# Patient Record
Sex: Male | Born: 1985 | Race: White | Hispanic: No | Marital: Married | Smoking: Never smoker
Health system: Southern US, Community
[De-identification: ages and names within clinical notes are randomized; demographics above are authoritative.]

## PROBLEM LIST (undated history)

## (undated) DIAGNOSIS — J45909 Unspecified asthma, uncomplicated: Secondary | ICD-10-CM

## (undated) HISTORY — DX: Unspecified asthma, uncomplicated: J45.909

---

## 2014-05-06 ENCOUNTER — Ambulatory Visit (HOSPITAL_BASED_OUTPATIENT_CLINIC_OR_DEPARTMENT_OTHER): Payer: BC Managed Care – PPO | Attending: Family Medicine | Admitting: Radiology

## 2014-05-06 VITALS — Ht 69.0 in | Wt 195.0 lb

## 2014-05-06 DIAGNOSIS — R0609 Other forms of dyspnea: Secondary | ICD-10-CM | POA: Diagnosis not present

## 2014-05-06 DIAGNOSIS — R0989 Other specified symptoms and signs involving the circulatory and respiratory systems: Secondary | ICD-10-CM | POA: Insufficient documentation

## 2014-05-06 DIAGNOSIS — G4710 Hypersomnia, unspecified: Secondary | ICD-10-CM | POA: Diagnosis present

## 2014-05-06 DIAGNOSIS — R5383 Other fatigue: Secondary | ICD-10-CM

## 2014-05-06 DIAGNOSIS — R5381 Other malaise: Secondary | ICD-10-CM | POA: Diagnosis not present

## 2014-05-06 DIAGNOSIS — R0683 Snoring: Secondary | ICD-10-CM

## 2014-05-06 DIAGNOSIS — G4730 Sleep apnea, unspecified: Secondary | ICD-10-CM | POA: Diagnosis present

## 2014-05-06 DIAGNOSIS — G479 Sleep disorder, unspecified: Secondary | ICD-10-CM | POA: Insufficient documentation

## 2014-05-09 DIAGNOSIS — R0683 Snoring: Secondary | ICD-10-CM

## 2014-05-09 DIAGNOSIS — R5381 Other malaise: Secondary | ICD-10-CM

## 2014-05-09 DIAGNOSIS — R5383 Other fatigue: Secondary | ICD-10-CM

## 2014-05-09 NOTE — Sleep Study (Signed)
   NAME: Lance BushMarcus West DATE OF BIRTH:  1985/09/03 MEDICAL RECORD NUMBER [id number]  LOCATION: [facility]  PHYSICIAN: Suleyman Ehrman D  DATE OF STUDY: 05/06/2014  SLEEP STUDY TYPE: Nocturnal Polysomnogram               REFERRING PHYSICIAN: SwazilandJordan, Betty G, MD  INDICATION FOR STUDY: Hypersomnia with sleep apnea  EPWORTH SLEEPINESS SCORE:   13/24 HEIGHT: 5\' 9"  (175.3 cm)  WEIGHT: 195 lb (88.451 kg)    Body mass index is 28.78 kg/(m^2).  NECK SIZE: 16 in.  MEDICATIONS: Charted for review  SLEEP ARCHITECTURE: Total sleep time 305 minutes with sleep efficiency 83.7%. Stage I was 11.3%, stage II 79%, stage III absent, REM 9.7% of total sleep time. Sleep latency 47 minutes, REM latency 202 minutes, awake after sleep onset 12 minutes, arousal index 15.9, bedtime medication: Albuterol  RESPIRATORY DATA: Apnea hypopneas index (AHI) 3.9 per hour. 20 total events scored including one obstructive apnea, 8 central apneas, 11 hypopneas. Events were recorded in all positions, especially while supine. REM AHI of 4.1 per hour. The study was ordered as a diagnostic polysomnogram without CPAP.  OXYGEN DATA: Mild snoring with oxygen desaturation to a nadir of 88% and mean saturation 95.3% on room air.  CARDIAC DATA: Sinus rhythm with PACs  MOVEMENT/PARASOMNIA: No significant movement disturbance, no bathroom trips  IMPRESSION/ RECOMMENDATION:   1) Within normal limits. Occasional respiratory event with sleep disturbance, AHI 3.9 per hour. The normal range for adults is an AHI from 0-5 events per hour). Mild snoring with oxygen desaturation to a nadir of 88% and mean saturation 95.3% on room air.   [name],[name] D Diplomate, American Board of Sleep Medicine  ELECTRONICALLY SIGNED ON:  05/09/2014, 11:34 AM [facility] PH: [phone]   FX: [phone] ACCREDITED BY THE AMERICAN ACADEMY OF SLEEP MEDICINE

## 2015-10-07 ENCOUNTER — Encounter (HOSPITAL_COMMUNITY): Payer: Self-pay | Admitting: Emergency Medicine

## 2015-10-07 ENCOUNTER — Emergency Department (HOSPITAL_COMMUNITY): Payer: BLUE CROSS/BLUE SHIELD

## 2015-10-07 ENCOUNTER — Emergency Department (HOSPITAL_COMMUNITY)
Admission: EM | Admit: 2015-10-07 | Discharge: 2015-10-07 | Disposition: A | Discharge status: Home / Self Care | Payer: BLUE CROSS/BLUE SHIELD | Attending: Emergency Medicine | Admitting: Emergency Medicine

## 2015-10-07 DIAGNOSIS — R55 Syncope and collapse: Secondary | ICD-10-CM | POA: Diagnosis present

## 2015-10-07 DIAGNOSIS — R0789 Other chest pain: Secondary | ICD-10-CM

## 2015-10-07 DIAGNOSIS — R079 Chest pain, unspecified: Secondary | ICD-10-CM | POA: Diagnosis not present

## 2015-10-07 DIAGNOSIS — Z79899 Other long term (current) drug therapy: Secondary | ICD-10-CM | POA: Diagnosis not present

## 2015-10-07 DIAGNOSIS — R001 Bradycardia, unspecified: Secondary | ICD-10-CM | POA: Insufficient documentation

## 2015-10-07 LAB — BASIC METABOLIC PANEL
Anion gap: 11 (ref 5–15)
BUN: 12 mg/dL (ref 6–20)
CHLORIDE: 102 mmol/L (ref 101–111)
CO2: 27 mmol/L (ref 22–32)
CREATININE: 1.31 mg/dL — AB (ref 0.61–1.24)
Calcium: 9.6 mg/dL (ref 8.9–10.3)
GFR calc Af Amer: >60 mL/min (ref >60)
GFR calc non Af Amer: >60 mL/min (ref >60)
GLUCOSE: 111 mg/dL — AB (ref 65–99)
Potassium: 4.4 mmol/L (ref 3.5–5.1)
SODIUM: 140 mmol/L (ref 135–145)

## 2015-10-07 LAB — CBC
HCT: 43.7 % (ref 39.0–52.0)
Hemoglobin: 14.9 g/dL (ref 13.0–17.0)
MCH: 29.3 pg (ref 26.0–34.0)
MCHC: 34.1 g/dL (ref 30.0–36.0)
MCV: 85.9 fL (ref 78.0–100.0)
PLATELETS: 232 10*3/uL (ref 150–400)
RBC: 5.09 MIL/uL (ref 4.22–5.81)
RDW: 12.9 % (ref 11.5–15.5)
WBC: 7.2 10*3/uL (ref 4.0–10.5)

## 2015-10-07 LAB — D-DIMER, QUANTITATIVE (NOT AT ARMC)

## 2015-10-07 LAB — I-STAT TROPONIN, ED: Troponin i, poc: 0 ng/mL (ref 0.00–0.08)

## 2015-10-07 MED ORDER — IBUPROFEN 400 MG PO TABS
600.0000 mg | ORAL_TABLET | Freq: Once | ORAL | Status: AC
  Administered 2015-10-07: 600 mg via ORAL
  Filled 2015-10-07: qty 1

## 2015-10-07 MED ORDER — DIAZEPAM 5 MG PO TABS
5.0000 mg | ORAL_TABLET | Freq: Once | ORAL | Status: AC
  Administered 2015-10-07: 5 mg via ORAL
  Filled 2015-10-07: qty 1

## 2015-10-07 MED ORDER — OXYCODONE-ACETAMINOPHEN 5-325 MG PO TABS: 2.0000 | ORAL_TABLET | ORAL | Status: DC | PRN

## 2015-10-07 MED ORDER — METHOCARBAMOL 750 MG PO TABS: 750.0000 mg | ORAL_TABLET | Freq: Four times a day (QID) | ORAL | Status: DC

## 2015-10-07 MED ORDER — NAPROXEN 500 MG PO TABS: 500.0000 mg | ORAL_TABLET | Freq: Two times a day (BID) | ORAL | Status: DC

## 2015-10-07 MED ORDER — OXYCODONE-ACETAMINOPHEN 5-325 MG PO TABS
1.0000 | ORAL_TABLET | Freq: Once | ORAL | Status: AC
  Administered 2015-10-07: 1 via ORAL
  Filled 2015-10-07: qty 1

## 2015-10-07 NOTE — Discharge Instructions (Signed)

## 2015-10-07 NOTE — ED Provider Notes (Addendum)
CSN: [id number]     Arrival date & time 10/07/15  1525 History   First MD Initiated Contact with Patient 10/07/15 2022     Chief Complaint  Patient presents with  . 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 No associated anginal symptoms. Pain is better with remaining still. This also worse with taking a deep breath. Pain also is worse when he moves his left arm. No associated syncope or near-syncopal. No prior history of same. No associated diaphoresis or dyspnea. Use prior to arrival  Patient is a 30 y.o. male presenting with chest pain. The history is provided by the patient and the spouse.  Chest Pain   History reviewed. No pertinent past medical history. History reviewed. No pertinent past surgical history. No family history on file. Social History  Substance Use Topics  . Smoking status: Never Smoker   . Smokeless tobacco: None  . Alcohol Use: No    Review of Systems  Cardiovascular: Positive for chest pain.  All other systems reviewed and are negative.     Allergies  Ancef  Home Medications   Prior to Admission medications   Medication Sig Start Date End Date Taking? Authorizing Provider  Cyanocobalamin (VITAMIN B 12 PO) Take 1 tablet by mouth daily.   Yes Historical Provider, MD  DULoxetine (CYMBALTA) 60 MG capsule Take 60 mg by mouth daily.   Yes Historical Provider, MD   BP 148/99 mmHg  Pulse 101  Temp(Src) 98.1 F (36.7 C) (Oral)  Resp 16  Ht  (1.753 m)  Wt 95.255 kg  BMI 31.00 kg/m2  SpO2 98% Physical Exam  Constitutional: He is oriented to person, place, and time. He appears well-developed and well-nourished.  Non-toxic appearance. No distress.  HENT:  Head: Normocephalic and atraumatic.  Eyes: Conjunctivae, EOM and lids are normal. Pupils  are equal, round, and reactive to light.  Neck: Normal range of motion. Neck supple. No tracheal deviation present. No thyroid mass present.  Cardiovascular: Normal rate, regular rhythm and normal heart sounds.  Exam reveals no gallop.   No murmur heard. Pulmonary/Chest: Effort normal and breath sounds normal. No stridor. No respiratory distress. He has no decreased breath sounds. He has no wheezes. He has no rhonchi. He has no rales. He exhibits bony tenderness. He exhibits no crepitus.    Abdominal: Soft. Normal appearance and bowel sounds are normal. He exhibits no distension. There is no tenderness. There is no rebound and no CVA tenderness.  Musculoskeletal: Normal range of motion. He exhibits no edema or tenderness.  Neurological: He is alert and oriented to person, place, and time. He has normal strength. No cranial nerve deficit or sensory deficit. GCS eye subscore is 4. GCS verbal subscore is 5. GCS motor subscore is 6.  Skin: Skin is warm and dry. No abrasion and no rash noted.  Psychiatric: He has a normal mood and affect. His speech is normal and behavior is normal.  Nursing note and vitals reviewed.   ED Course  Procedures (including critical care time) Labs Review Labs Reviewed  BASIC METABOLIC PANEL - Abnormal; Notable for the following:    Glucose, Bld 111 (*)    Creatinine, Ser 1.31 (*)    All other components within normal limits  CBC  Rosezena Sensor, ED    Imaging Review Dg Chest 2 View  10/07/2015  CLINICAL DATA:  Left-sided chest pain EXAM: CHEST  2 VIEW COMPARISON:  None. FINDINGS: The heart size and mediastinal contours are within normal limits. Both lungs are clear. The visualized skeletal structures are unremarkable. IMPRESSION: No active cardiopulmonary disease. Electronically Signed   By: Alcide Clever M.D.   On: 10/07/2015 16:15   I have personally reviewed and evaluated these images and lab results as part of my medical decision-making.   EKG  Interpretation   Date/Time:  Thursday October 07 2015 15:29:07 EST Ventricular Rate:  107 PR Interval:  150 QRS Duration: 90 QT Interval:  332 QTC Calculation: 443 R Axis:   65 Text Interpretation:  Sinus tachycardia Otherwise normal ECG Confirmed by  Kahlani Graber  MD, Annais Crafts ([id number]) on 10/07/2015 8:37:00 PM      MDM   Final diagnoses:  None     Patient with negative d-dimer here. Suspect chest wall pain. Doubt ACS or PE. Patient stable for discharge    Lorre Nick, MD 10/07/15 2221  Lorre Nick, MD 10/07/15 2226

## 2015-10-07 NOTE — ED Notes (Signed)
Pt was at work today and was lifting heavy espresso machine and had a sudden onset of sharp left sided chest pain. Pt states pain is still there worse with a deep breath. Pt also states at the time he had a discomfort in his left arm.

## 2019-05-22 ENCOUNTER — Other Ambulatory Visit (HOSPITAL_COMMUNITY): Payer: Self-pay | Admitting: Family Medicine

## 2019-05-22 DIAGNOSIS — Z8279 Family history of other congenital malformations, deformations and chromosomal abnormalities: Secondary | ICD-10-CM

## 2019-06-02 ENCOUNTER — Encounter (INDEPENDENT_AMBULATORY_CARE_PROVIDER_SITE_OTHER): Payer: Self-pay

## 2019-06-02 ENCOUNTER — Other Ambulatory Visit: Payer: Self-pay

## 2019-06-02 ENCOUNTER — Ambulatory Visit (HOSPITAL_COMMUNITY): Payer: BLUE CROSS/BLUE SHIELD | Attending: Cardiology

## 2019-06-02 DIAGNOSIS — Z8279 Family history of other congenital malformations, deformations and chromosomal abnormalities: Secondary | ICD-10-CM | POA: Diagnosis not present

## 2019-09-11 DIAGNOSIS — Z03818 Encounter for observation for suspected exposure to other biological agents ruled out: Secondary | ICD-10-CM | POA: Diagnosis not present

## 2019-09-11 DIAGNOSIS — U071 COVID-19: Secondary | ICD-10-CM | POA: Diagnosis not present

## 2019-09-11 DIAGNOSIS — Z20828 Contact with and (suspected) exposure to other viral communicable diseases: Secondary | ICD-10-CM | POA: Diagnosis not present

## 2019-09-11 DIAGNOSIS — Z20822 Contact with and (suspected) exposure to covid-19: Secondary | ICD-10-CM | POA: Diagnosis not present

## 2020-02-18 DIAGNOSIS — F419 Anxiety disorder, unspecified: Secondary | ICD-10-CM | POA: Diagnosis not present

## 2020-02-18 DIAGNOSIS — J45909 Unspecified asthma, uncomplicated: Secondary | ICD-10-CM | POA: Diagnosis not present

## 2020-02-18 DIAGNOSIS — E782 Mixed hyperlipidemia: Secondary | ICD-10-CM | POA: Diagnosis not present

## 2020-11-08 ENCOUNTER — Other Ambulatory Visit: Payer: Self-pay

## 2020-11-08 ENCOUNTER — Ambulatory Visit (INDEPENDENT_AMBULATORY_CARE_PROVIDER_SITE_OTHER): Payer: 59 | Admitting: Family Medicine

## 2020-11-08 VITALS — BP 126/84 | Ht 69.0 in | Wt 215.0 lb

## 2020-11-08 DIAGNOSIS — M5010 Cervical disc disorder with radiculopathy, unspecified cervical region: Secondary | ICD-10-CM

## 2020-11-08 MED ORDER — PREDNISONE 10 MG PO TABS: ORAL_TABLET | ORAL | 0 refills | Status: DC

## 2020-11-08 NOTE — Patient Instructions (Signed)
You have cervical radiculopathy (a pinched nerve in the neck). Prednisone 6 day dose pack to relieve irritation/inflammation of the nerve. Don't take aleve or ibuprofen while on prednisone. Ok to take tylenol if you need something in addition to prednisone. Consider muscle relaxant if you're having trouble sleeping. Consider cervical collar if severely painful. Simple range of motion exercises within limits of pain to prevent further stiffness. Consider physical therapy for stretching, exercises, traction, and modalities. Heat 15 minutes at a time 3-4 times a day to help with spasms. Watch head position when on computers, texting, when sleeping in bed - should in line with back to prevent further nerve traction and irritation. Consider home traction unit if you get benefit with this in physical therapy. If not improving we will consider an MRI. Keep me updated on how you're doing over the next week.

## 2020-11-09 ENCOUNTER — Encounter: Payer: Self-pay | Admitting: Family Medicine

## 2020-11-09 NOTE — Progress Notes (Signed)
PCP: Joycelyn Rua, MD  Subjective:   HPI: Patient is a 35 y.o. male here for neck pain.  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 No numbness or tingling. No radiation past the elbow. Pain has persisted and worse with extension of neck, cough Has seen chiropractor twice without much benefit. Prior history of labral surgery of right shoulder with continued subluxation episodes, most recent 1.5 years ago but current pain feels different. Difficulty sleeping and throwing. No bowel/bladder dysfunction.  History reviewed. No pertinent past medical history.  Current Outpatient Medications on File Prior to Visit  Medication Sig Dispense Refill  . Cyanocobalamin (VITAMIN B 12 PO) Take 1 tablet by mouth daily.    . DULoxetine (CYMBALTA) 60 MG capsule Take 60 mg by mouth daily.     No current facility-administered medications on file prior to visit.    History reviewed. No pertinent surgical history.  Allergies  Allergen Reactions  . Ancef [Cefazolin]     Nausea     Social History   Socioeconomic History  . Marital status: Married    Spouse name: Not on file  . Number of children: Not on file  . Years of education: Not on file  . Highest education level: Not on file  Occupational History  . Not on file  Tobacco Use  . Smoking status: Never Smoker  . Smokeless tobacco: Not on file  Substance and Sexual Activity  . Alcohol use: No  . Drug use: Not on file  . Sexual activity: Not on file  Other Topics Concern  . Not on file  Social History Narrative  . Not on file   Social Determinants of Health   Financial Resource Strain: Not on file  Food Insecurity: Not on file  Transportation Needs: Not on file  Physical Activity: Not on file  Stress: Not on file  Social Connections: Not on file  Intimate Partner Violence: Not on file    History reviewed. No pertinent family history.  BP 126/84    Ht 5\' 9"  (1.753 m)   Wt 215 lb (97.5 kg)   BMI 31.75 kg/m   No flowsheet data found.  No flowsheet data found.  Review of Systems: See HPI above.     Objective:  Physical Exam:  Gen: NAD, comfortable in exam room  Neck: No gross deformity, swelling, bruising. TTP right trapezius, cervical paraspinal region.  No midline/bony TTP. Extension only to 15 degrees with pain.  Pain reproduced with flexion and left lateral rotation as well. BUE strength 5/5.   Sensation intact to light touch.   Trace equal reflexes in triceps, biceps, brachioradialis tendons. Mild positive spurlings.  Right shoulder: No swelling, ecchymoses.  No gross deformity. No TTP. FROM. Negative Hawkins, Neers. Negative Yergasons. Strength 5/5 with empty can and resisted internal/external rotation. Negative apprehension. NV intact distally.  Complete MSK u/s right shoulder: Biceps tendon: intact without tenosynovitis Pec major tendon: intact Subscapularis: intact without abnormalities AC joint: minimal arthropathy Infraspinatus: intact without abnormalities Supraspinatus: intact without abnormalities Posterior glenohumeral joint: no effusion.  Visible labrum appears normal.  Impression:  Normal ultrasound right shoulder    Assessment & Plan:  1. Cervical radiculopathy - patient's history and exam along with reassuring ultrasound of right shoulder consistent with cervical radiculopathy.  No red flags.  Start prednisone dose pack.  Ergonomic issues discussed.  Heat if needed.  Consider MRI if he doesn't improve as expected.  Easy home exercises - consider formal physical therapy as well.  Let us know how he's doing over next week.

## 2020-11-30 ENCOUNTER — Encounter: Payer: Self-pay | Admitting: Family Medicine

## 2020-12-13 NOTE — Progress Notes (Signed)
[name] [address] [place] [address] Phone: [phone] Subjective:   I [name] am serving as a Neurosurgeon for Dr. Antoine Primas.  This visit occurred during the SARS-CoV-2 public health emergency.  Safety protocols were in place, including screening questions prior to the visit, additional usage of staff PPE, and extensive cleaning of exam room while observing appropriate contact time as indicated for disinfecting solutions.   I'm seeing this patient by the request  of:  Joycelyn Rua, MD  CC: Right-sided neck pain  [id number]  Lance West is a 35 y.o. male coming in with complaint of neck and R shoulder pain for one month after drying hair. Patient states he was drying his hair when he felt a tweak that has gotten worse.   Onset- 3 months  Location - neck is where the pain is and with certain ROM he has numbness down the arm  Duration- consistent  Character- dull annoying pain Aggravating factors- rotation of the neck, leaning over  Therapies tried- pain reliever, ice, heat, chiropractor twice, PT, steriods Severity- 2/10 at its worse      No past medical history on file. No past surgical history on file. Social History   Socioeconomic History  . Marital status: Married    Spouse name: Not on file  . Number of children: Not on file  . Years of education: Not on file  . Highest education level: Not on file  Occupational History  . Not on file  Tobacco Use  . Smoking status: Never Smoker  . Smokeless tobacco: Not on file  Substance and Sexual Activity  . Alcohol use: No  . Drug use: Not on file  . Sexual activity: Not on file  Other Topics Concern  . Not on file  Social History Narrative  . Not on file   Social Determinants of Health   Financial Resource Strain: Not on file  Food Insecurity: Not on file  Transportation Needs: Not on file  Physical Activity: Not on file  Stress: Not on file  Social  Connections: Not on file   Allergies  Allergen Reactions  . Ancef [Cefazolin]     Nausea    No family history on file.  Current Outpatient Medications (Endocrine & Metabolic):  .  predniSONE (DELTASONE) 10 MG tablet, 6 tabs po day 1, 5 tabs po day 2, 4 tabs po day 3, 3 tabs po day 4, 2 tabs po day 5, 1 tab po day 6    Current Outpatient Medications (Analgesics):  .  meloxicam (MOBIC) 15 MG tablet, Take 1 tablet (15 mg total) by mouth daily.  Current Outpatient Medications (Hematological):  Marland Kitchen  Cyanocobalamin (VITAMIN B 12 PO), Take 1 tablet by mouth daily.  Current Outpatient Medications (Other):  Marland Kitchen  DULoxetine (CYMBALTA) 60 MG capsule, Take 60 mg by mouth daily. Marland Kitchen  gabapentin (NEURONTIN) 100 MG capsule, Take 2 capsules (200 mg total) by mouth at bedtime.   Reviewed prior external information including notes and imaging from  primary care provider As well as notes that were available from care everywhere and other healthcare systems.  Past medical history, social, surgical and family history all reviewed in electronic medical record.  No pertanent information unless stated regarding to the chief complaint.   Review of Systems:  No headache, visual changes, nausea, vomiting, diarrhea, constipation, dizziness, abdominal pain, skin rash, fevers, chills, night sweats, weight loss, swollen lymph nodes, body aches, joint swelling, chest pain, shortness of  breath, mood changes. POSITIVE muscle aches  Objective  Blood pressure 132/82, pulse 85, height 5\' 9"  (1.753 m), weight 220 lb (99.8 kg), SpO2 97 %.   General: No apparent distress alert and oriented x3 mood and affect normal, dressed appropriately.  HEENT: Pupils equal, extraocular movements intact  Respiratory: Patient's speak in full sentences and does not appear short of breath  Cardiovascular: No lower extremity edema, non tender, no erythema  Gait normal with good balance and coordination.  MSK:   Right side of the neck  does have some mild tightness compared to the contralateral side.  Patient does have some limited sidebending.  Negative Spurling's today but patient does have weakness noted in the C8 distribution compared to the contralateral side.  Patient also does have tightness noted with extension of the neck.  Good range of motion of the shoulder.  Negative impingement.  5-5 strength of the rotator cuff.  Osteopathic findings C4 flexed rotated and side bent right C7 flexed rotated and side bent right T3 extended rotated and side bent left    Impression and Recommendations:    The above documentation has been reviewed and is accurate and complete , DO

## 2020-12-15 ENCOUNTER — Ambulatory Visit (INDEPENDENT_AMBULATORY_CARE_PROVIDER_SITE_OTHER): Payer: 59 | Admitting: Family Medicine

## 2020-12-15 ENCOUNTER — Other Ambulatory Visit: Payer: Self-pay

## 2020-12-15 ENCOUNTER — Encounter: Payer: Self-pay | Admitting: Family Medicine

## 2020-12-15 ENCOUNTER — Ambulatory Visit (INDEPENDENT_AMBULATORY_CARE_PROVIDER_SITE_OTHER): Payer: 59

## 2020-12-15 VITALS — BP 132/82 | HR 85 | Ht 69.0 in | Wt 220.0 lb

## 2020-12-15 DIAGNOSIS — G8929 Other chronic pain: Secondary | ICD-10-CM | POA: Diagnosis not present

## 2020-12-15 DIAGNOSIS — M542 Cervicalgia: Secondary | ICD-10-CM

## 2020-12-15 DIAGNOSIS — M9902 Segmental and somatic dysfunction of thoracic region: Secondary | ICD-10-CM

## 2020-12-15 DIAGNOSIS — M9901 Segmental and somatic dysfunction of cervical region: Secondary | ICD-10-CM

## 2020-12-15 MED ORDER — GABAPENTIN 100 MG PO CAPS: 200.0000 mg | ORAL_CAPSULE | Freq: Every day | ORAL | 3 refills | Status: DC

## 2020-12-15 MED ORDER — MELOXICAM 15 MG PO TABS: 15.0000 mg | ORAL_TABLET | Freq: Every day | ORAL | 0 refills | Status: DC

## 2020-12-15 NOTE — Assessment & Plan Note (Signed)
Patient does have what appears to be more neck pain.  Patient does have unfortunately numbness and does have some weakness in the C8 distribution.  Patient did respond fairly well to the osteopathic manipulation.  He started on a very low-dose of gabapentin as well as meloxicam during the day.  Patient has discontinued the prednisone.  We will get x-rays of the cervical spine.  Worsening pain I do feel that MRI is necessary.  Given home exercises at this point with patient not responding well to the physical therapy.  Follow-up with me again in 3 to 4 weeks.  Patient knows if worsening symptoms to seek medical attention immediately.

## 2020-12-15 NOTE — Patient Instructions (Addendum)
Good to see you Xray today meloxicam 15 mg daily for 10 days then as needed  Do not use NSAIDS such as Advil or Aleve when taking Meloxicam It is ok to use Tylenol for additional pain relief stop if it hurts your stomach Gabapentin 200 mg at night Keep hands within peripheral vision Tell Vincenza Hews he has to coach for now on See me again in 3-4 weeks ok to double book  Worsening pain write me we will get MRI

## 2020-12-15 NOTE — Assessment & Plan Note (Signed)
   Decision today to treat with OMT was based on Physical Exam  After verbal consent patient was treated with HVLA, ME, FPR techniques in cervical, thoracic,areas,   Patient tolerated the procedure well with improvement in symptoms  Patient given exercises, stretches and lifestyle modifications  See medications in patient instructions if given  Patient will follow up in 3-4 weeks

## 2021-01-05 NOTE — Progress Notes (Signed)
[facility] [address] [place] [address] Phone: [phone] Subjective:   I Lance West am serving as a Neurosurgeon for Dr. Antoine Primas.  This visit occurred during the SARS-CoV-2 public health emergency.  Safety protocols were in place, including screening questions prior to the visit, additional usage of staff PPE, and extensive cleaning of exam room while observing appropriate contact time as indicated for disinfecting solutions.   I'm seeing this patient by the request  of:  Joycelyn Rua, MD  CC: Neck pain follow-up  [id number]  Lance West is a 35 y.o. male coming in with complaint of back and neck pain. OMT 12/15/2020. Patient states he is still in pain. States he can feel a little difference from the medicine. States with increase activity the back is sore and he still has numbness in his arm.   Medications patient has been prescribed: Gabapentin, Meloxicam  Taking:  Cervical xray 12/15/2020 IMPRESSION: Mild reversal of lordotic curvature is likely due to chronic muscle spasm. No fracture or spondylolisthesis. No appreciable disc space narrowing. Facet hypertrophy with exit foraminal narrowing is noted at C4-5 bilaterally        Reviewed prior external information including notes and imaging from previsou exam, outside providers and external EMR if available.   As well as notes that were available from care everywhere and other healthcare systems.  Past medical history, social, surgical and family history all reviewed in electronic medical record.  No pertanent information unless stated regarding to the chief complaint.   No past medical history on file.  Allergies  Allergen Reactions  . Ancef [Cefazolin]     Nausea      Review of Systems:  No headache, visual changes, nausea, vomiting, diarrhea, constipation, dizziness, abdominal pain, skin rash, fevers, chills, night sweats, weight loss, swollen lymph nodes, body  aches, joint swelling, chest pain, shortness of breath, mood changes. POSITIVE muscle aches  Objective  Blood pressure 130/90, pulse 91, height 5\' 9"  (1.753 m), weight 221 lb (100.2 kg), SpO2 96 %.   General: No apparent distress alert and oriented x3 mood and affect normal, dressed appropriately.  HEENT: Pupils equal, extraocular movements intact  Respiratory: Patient's speak in full sentences and does not appear short of breath  Cardiovascular: No lower extremity edema, non tender, no erythema  Gait normal with good balance and coordination.  MSK:  Non tender with full range of motion and good stability and symmetric strength and tone of shoulders, elbows, wrist, hip, knee and ankles bilaterally.  Back -neck exam shows continued mild right-sided tightness noted of the neck.  Patient does have a positive Spurling in the C5 and C6 distribution on the right arm.  Patient does have maybe weakness now noted in the brachial radialis reflex mild decrease in grip strength as well.         Assessment and Plan:  Neck pain on right side Patient continues to have radicular symptoms unfortunately going down the right arm in the C5 and C6 distribution.  Concerning at this moment with patient failing all conservative therapy.  We discussed the potential aspect of continuing with the osteopathic manipulation but no significant improvement was noted on the initial 1.  At this point patient has seen other providers for this and has not made significant strides.  We discussed the possibility of advanced imaging which patient has agreed to.  Patient has failed all other conservative therapy including medications, physical therapy and manipulation at this time.  Depending on findings we will discuss if patient is a candidate for epidural injections.        The above documentation has been reviewed and is accurate and complete Judi Saa, DO       Note: This dictation was prepared with Dragon  dictation along with smaller phrase technology. Any transcriptional errors that result from this process are unintentional.

## 2021-01-06 ENCOUNTER — Encounter: Payer: Self-pay | Admitting: Family Medicine

## 2021-01-06 ENCOUNTER — Ambulatory Visit (INDEPENDENT_AMBULATORY_CARE_PROVIDER_SITE_OTHER): Payer: 59 | Admitting: Family Medicine

## 2021-01-06 ENCOUNTER — Other Ambulatory Visit: Payer: Self-pay

## 2021-01-06 VITALS — BP 130/90 | HR 91 | Ht 69.0 in | Wt 221.0 lb

## 2021-01-06 DIAGNOSIS — M542 Cervicalgia: Secondary | ICD-10-CM | POA: Diagnosis not present

## 2021-01-06 NOTE — Patient Instructions (Signed)
Good to see you MRI of the neck Try 3 pills of gabapentin at night for a week If it improves contact us for prescription if no improvement stop After MRI I will talk to you about next steps [facility] [phone]

## 2021-01-06 NOTE — Assessment & Plan Note (Signed)
Patient continues to have radicular symptoms unfortunately going down the right arm in the C5 and C6 distribution.  Concerning at this moment with patient failing all conservative therapy.  We discussed the potential aspect of continuing with the osteopathic manipulation but no significant improvement was noted on the initial 1.  At this point patient has seen other providers for this and has not made significant strides.  We discussed the possibility of advanced imaging which patient has agreed to.  Patient has failed all other conservative therapy including medications, physical therapy and manipulation at this time.  Depending on findings we will discuss if patient is a candidate for epidural injections.

## 2021-01-10 ENCOUNTER — Ambulatory Visit
Admission: RE | Admit: 2021-01-10 | Discharge: 2021-01-10 | Disposition: A | Discharge status: Home / Self Care | Payer: 59 | Source: Ambulatory Visit | Attending: Family Medicine | Admitting: Family Medicine

## 2021-01-10 ENCOUNTER — Other Ambulatory Visit: Payer: Self-pay

## 2021-01-10 DIAGNOSIS — M542 Cervicalgia: Secondary | ICD-10-CM

## 2021-01-12 ENCOUNTER — Other Ambulatory Visit: Payer: Self-pay

## 2021-01-12 DIAGNOSIS — M5412 Radiculopathy, cervical region: Secondary | ICD-10-CM

## 2021-01-18 ENCOUNTER — Ambulatory Visit
Admission: RE | Admit: 2021-01-18 | Discharge: 2021-01-18 | Disposition: A | Discharge status: Home / Self Care | Payer: 59 | Source: Ambulatory Visit | Attending: Family Medicine | Admitting: Family Medicine

## 2021-01-18 DIAGNOSIS — M5412 Radiculopathy, cervical region: Secondary | ICD-10-CM

## 2021-01-18 MED ORDER — IOPAMIDOL (ISOVUE-M 300) INJECTION 61%
1.0000 mL | Freq: Once | INTRAMUSCULAR | Status: AC | PRN
  Administered 2021-01-18: 1 mL via EPIDURAL

## 2021-01-18 MED ORDER — TRIAMCINOLONE ACETONIDE 40 MG/ML IJ SUSP (RADIOLOGY)
60.0000 mg | Freq: Once | INTRAMUSCULAR | Status: AC
  Administered 2021-01-18: 60 mg via EPIDURAL

## 2021-01-18 NOTE — Discharge Instructions (Signed)
Post Procedure Spinal Discharge Instruction Sheet  You may resume a regular diet and any medications that you routinely take (including pain medications).  No driving day of procedure.  Light activity throughout the rest of the day.  Do not do any strenuous work, exercise, bending or lifting.  The day following the procedure, you can resume normal physical activity but you should refrain from exercising or physical therapy for at least three days thereafter.   Common Side Effects:  Headaches- take your usual medications as directed by your physician.  Increase your fluid intake.  Caffeinated beverages may be helpful.  Lie flat in bed until your headache resolves.  Restlessness or inability to sleep- you may have trouble sleeping for the next few days.  Ask your referring physician if you need any medication for sleep.  Facial flushing or redness- should subside within a few days.  Increased pain- a temporary increase in pain a day or two following your procedure is not unusual.  Take your pain medication as prescribed by your referring physician.  Leg cramps  Please contact our office at [phone] for the following symptoms: Fever greater than 100 degrees. Headaches unresolved with medication after 2-3 days. Increased swelling, pain, or redness at injection site.   Thank you for visiting [facility] today.   

## 2021-02-18 ENCOUNTER — Other Ambulatory Visit: Payer: Self-pay | Admitting: Family Medicine

## 2021-03-16 ENCOUNTER — Encounter: Payer: Self-pay | Admitting: Family Medicine

## 2021-03-21 NOTE — Progress Notes (Signed)
[facility] [address] [place] [address] Phone: [phone] Subjective:   Lance West, am serving as a scribe for Dr. Antoine Primas.  I'm seeing this patient by the request  of:  Joycelyn Rua, MD  CC: Neck pain follow-up  [id number]  01/06/2021 Patient continues to have radicular symptoms unfortunately going down the right arm in the C5 and C6 distribution.  Concerning at this moment with patient failing all conservative therapy.  We discussed the potential aspect of continuing with the osteopathic manipulation but no significant improvement was noted on the initial 1.  At this point patient has seen other providers for this and has not made significant strides.  We discussed the possibility of advanced imaging which patient has agreed to.  Patient has failed all other conservative therapy including medications, physical therapy and manipulation at this time.  Depending on findings we will discuss if patient is a candidate for epidural injections.  Update 03/22/2021 Lance West is a 35 y.o. male coming in with complaint of neck pain. Epidural on 01/18/2021. States that the epidural lasted a month and now the pain is back and on the left side as well.  Patient states that it seems to be more of a tightness.  Not having as much visible radicular symptoms.  Patient wants to avoid getting that bad though.      No past medical history on file. No past surgical history on file. Social History   Socioeconomic History   Marital status: Married    Spouse name: Not on file   Number of children: Not on file   Years of education: Not on file   Highest education level: Not on file  Occupational History   Not on file  Tobacco Use   Smoking status: Never   Smokeless tobacco: Not on file  Substance and Sexual Activity   Alcohol use: No   Drug use: Not on file   Sexual activity: Not on file  Other Topics Concern   Not on file  Social  History Narrative   Not on file   Social Determinants of Health   Financial Resource Strain: Not on file  Food Insecurity: Not on file  Transportation Needs: Not on file  Physical Activity: Not on file  Stress: Not on file  Social Connections: Not on file   Allergies  Allergen Reactions   Ancef [Cefazolin]     Nausea    No family history on file.      Current Outpatient Medications (Hematological):    Cyanocobalamin (VITAMIN B 12 PO), Take 1 tablet by mouth daily.  Current Outpatient Medications (Other):    DULoxetine (CYMBALTA) 60 MG capsule, Take 60 mg by mouth daily.   Reviewed prior external information including notes and imaging from  primary care provider As well as notes that were available from care everywhere and other healthcare systems.  Past medical history, social, surgical and family history all reviewed in electronic medical record.  No pertanent information unless stated regarding to the chief complaint.   Review of Systems:  No headache, visual changes, nausea, vomiting, diarrhea, constipation, dizziness, abdominal pain, skin rash, fevers, chills, night sweats, weight loss, swollen lymph nodes, body aches, joint swelling, chest pain, shortness of breath, mood changes. POSITIVE muscle aches  Objective  Blood pressure 110/80, pulse 82, height 5\' 9"  (1.753 m), weight 215 lb (97.5 kg), SpO2 97 %.   General: No apparent distress alert and oriented x3 mood and affect  normal, dressed appropriately.  HEENT: Pupils equal, extraocular movements intact  Respiratory: Patient's speak in full sentences and does not appear short of breath  Cardiovascular: No lower extremity edema, non tender, no erythema  Gait normal with good balance and coordination.  MSK: Neck exam does still have significant tightness noted in the paraspinal musculature of the thoracic spine in the parascapular region.  Neck exam does have some mild limited range of motion especially with  right-sided sidebending and right-sided rotation.  Osteopathic findings C5 flexed rotated and side bent right C7 flexed rotated and side bent left T4 extended rotated and side bent right with inhaled rib T8 extended rotated and side bent left   Impression and Recommendations:     The above documentation has been reviewed and is accurate and complete Judi Saa, DO

## 2021-03-22 ENCOUNTER — Encounter: Payer: Self-pay | Admitting: Family Medicine

## 2021-03-22 ENCOUNTER — Ambulatory Visit (INDEPENDENT_AMBULATORY_CARE_PROVIDER_SITE_OTHER): Payer: 59 | Admitting: Family Medicine

## 2021-03-22 ENCOUNTER — Other Ambulatory Visit: Payer: Self-pay

## 2021-03-22 VITALS — BP 110/80 | HR 82 | Ht 69.0 in | Wt 215.0 lb

## 2021-03-22 DIAGNOSIS — M9901 Segmental and somatic dysfunction of cervical region: Secondary | ICD-10-CM | POA: Diagnosis not present

## 2021-03-22 DIAGNOSIS — M2550 Pain in unspecified joint: Secondary | ICD-10-CM | POA: Diagnosis not present

## 2021-03-22 DIAGNOSIS — M5412 Radiculopathy, cervical region: Secondary | ICD-10-CM | POA: Diagnosis not present

## 2021-03-22 DIAGNOSIS — M542 Cervicalgia: Secondary | ICD-10-CM

## 2021-03-22 DIAGNOSIS — M9902 Segmental and somatic dysfunction of thoracic region: Secondary | ICD-10-CM | POA: Diagnosis not present

## 2021-03-22 DIAGNOSIS — M9908 Segmental and somatic dysfunction of rib cage: Secondary | ICD-10-CM

## 2021-03-22 LAB — CBC WITH DIFFERENTIAL/PLATELET
Basophils Absolute: 0 10*3/uL (ref 0.0–0.1)
Basophils Relative: 0.7 % (ref 0.0–3.0)
Eosinophils Absolute: 0.2 10*3/uL (ref 0.0–0.7)
Eosinophils Relative: 3.6 % (ref 0.0–5.0)
HCT: 43.6 % (ref 39.0–52.0)
Hemoglobin: 14.9 g/dL (ref 13.0–17.0)
Lymphocytes Relative: 37.8 % (ref 12.0–46.0)
Lymphs Abs: 2 10*3/uL (ref 0.7–4.0)
MCHC: 34.3 g/dL (ref 30.0–36.0)
MCV: 87.8 fl (ref 78.0–100.0)
Monocytes Absolute: 0.4 10*3/uL (ref 0.1–1.0)
Monocytes Relative: 7.3 % (ref 3.0–12.0)
Neutro Abs: 2.6 10*3/uL (ref 1.4–7.7)
Neutrophils Relative %: 50.6 % (ref 43.0–77.0)
Platelets: 237 10*3/uL (ref 150.0–400.0)
RBC: 4.96 Mil/uL (ref 4.22–5.81)
RDW: 13.2 % (ref 11.5–15.5)
WBC: 5.2 10*3/uL (ref 4.0–10.5)

## 2021-03-22 LAB — IBC PANEL
Iron: 72 ug/dL (ref 42–165)
Saturation Ratios: 16.3 % — ABNORMAL LOW (ref 20.0–50.0)
TIBC: 442.4 ug/dL (ref 250.0–450.0)
Transferrin: 316 mg/dL (ref 212.0–360.0)

## 2021-03-22 LAB — COMPREHENSIVE METABOLIC PANEL
ALT: 71 U/L — ABNORMAL HIGH (ref 0–53)
AST: 28 U/L (ref 0–37)
Albumin: 4.8 g/dL (ref 3.5–5.2)
Alkaline Phosphatase: 68 U/L (ref 39–117)
BUN: 14 mg/dL (ref 6–23)
CO2: 30 mEq/L (ref 19–32)
Calcium: 9.8 mg/dL (ref 8.4–10.5)
Chloride: 101 mEq/L (ref 96–112)
Creatinine, Ser: 1.1 mg/dL (ref 0.40–1.50)
GFR: 86.78 mL/min (ref >60.00)
Glucose, Bld: 85 mg/dL (ref 70–99)
Potassium: 3.9 mEq/L (ref 3.5–5.1)
Sodium: 141 mEq/L (ref 135–145)
Total Bilirubin: 0.4 mg/dL (ref 0.2–1.2)
Total Protein: 7.9 g/dL (ref 6.0–8.3)

## 2021-03-22 LAB — URIC ACID: Uric Acid, Serum: 8 mg/dL — ABNORMAL HIGH (ref 4.0–7.8)

## 2021-03-22 LAB — TSH: TSH: 1.3 u[IU]/mL (ref 0.35–5.50)

## 2021-03-22 LAB — SEDIMENTATION RATE: Sed Rate: 5 mm/hr (ref 0–15)

## 2021-03-22 LAB — T4, FREE: Free T4: 0.64 ng/dL (ref 0.60–1.60)

## 2021-03-22 LAB — T3, FREE: T3, Free: 3.2 pg/mL (ref 2.3–4.2)

## 2021-03-22 LAB — FERRITIN: Ferritin: 113.4 ng/mL (ref 22.0–322.0)

## 2021-03-22 NOTE — Patient Instructions (Addendum)
Good to see you  PT [facility] office for dry needling No change in medications Labs on the way out See me again in 5-6 weeks

## 2021-03-22 NOTE — Assessment & Plan Note (Signed)
Neck pain right side with radicular symptoms previously.  Still has some tightness but attempted osteopathic manipulation again at this time.  We will get patient into formal physical therapy hopefully for more than modalities such as dry needling, TENS unit, iontophoresis likely be more beneficial.  Patient is encouraged to continue to work on posture and ergonomics.  Follow-up with me again in 4 to 6 weeks.

## 2021-03-22 NOTE — Assessment & Plan Note (Signed)
   Decision today to treat with OMT was based on Physical Exam  After verbal consent patient was treated with HVLA, ME, FPR techniques in cervical, thoracic, rib, lareas, all areas are chronic   Patient tolerated the procedure well with improvement in symptoms  Patient given exercises, stretches and lifestyle modifications  See medications in patient instructions if given  Patient will follow up in 4-8 weeks 

## 2021-03-25 LAB — VITAMIN D 1,25 DIHYDROXY
Vitamin D 1, 25 (OH)2 Total: 62 pg/mL (ref 18–72)
Vitamin D2 1, 25 (OH)2: <8 pg/mL
Vitamin D3 1, 25 (OH)2: 62 pg/mL

## 2021-03-25 LAB — PTH, INTACT AND CALCIUM
Calcium: 10 mg/dL (ref 8.6–10.3)
PTH: 39 pg/mL (ref 16–77)

## 2021-03-25 LAB — ANA: Anti Nuclear Antibody (ANA): NEGATIVE

## 2021-03-25 LAB — RHEUMATOID FACTOR: Rheumatoid fact SerPl-aCnc: <14 IU/mL (ref <14)

## 2021-03-26 LAB — LIPID PANEL WITH LDL/HDL RATIO
Cholesterol, Total: 237 mg/dL — ABNORMAL HIGH (ref 100–199)
HDL: 44 mg/dL (ref >39)
LDL Chol Calc (NIH): 155 mg/dL — ABNORMAL HIGH (ref 0–99)
LDL/HDL Ratio: 3.5 ratio (ref 0.0–3.6)
Triglycerides: 209 mg/dL — ABNORMAL HIGH (ref 0–149)
VLDL Cholesterol Cal: 38 mg/dL (ref 5–40)

## 2021-03-26 LAB — TESTOSTERONE, FREE, TOTAL, SHBG
Sex Hormone Binding: 18.2 nmol/L (ref 16.5–55.9)
Testosterone, Free: 6.1 pg/mL — ABNORMAL LOW (ref 8.7–25.1)
Testosterone: 195 ng/dL — ABNORMAL LOW (ref 264–916)

## 2021-04-21 NOTE — Progress Notes (Signed)
  [name] [address] [place] [address] Phone: [phone] Subjective:   Lance [facility], am serving as a scribe for Dr. Antoine Primas.  I'm seeing this patient by the request  of:  Joycelyn Rua, MD  CC: Back and neck pain  [id number]  Lance West is a 35 y.o. male coming in with complaint of back and neck pain. OMT on 03/22/2021. Patient states today his back is doing well he has his good days and bad days.  Overall he does feel like he is making progress.  Feels like the knee pain as well as the severity and frequency has improved.  Medications patient has been prescribed: None         Reviewed prior external information including notes and imaging from previsou exam, outside providers and external EMR if available.   As well as notes that were available from care everywhere and other healthcare systems.  Past medical history, social, surgical and family history all reviewed in electronic medical record.  No pertanent information unless stated regarding to the chief complaint.   History reviewed. No pertinent past medical history.  Allergies  Allergen Reactions   Ancef [Cefazolin]     Nausea      Review of Systems:  No headache, visual changes, nausea, vomiting, diarrhea, constipation, dizziness, abdominal pain, skin rash, fevers, chills, night sweats, weight loss, swollen lymph nodes, body aches, joint swelling, chest pain, shortness of breath, mood changes. POSITIVE muscle aches  Objective  Blood pressure 122/84, pulse 79, height 5\' 9"  (1.753 m), weight 216 lb (98 kg), SpO2 95 %.   General: No apparent distress alert and oriented x3 mood and affect normal, dressed appropriately.  HEENT: Pupils equal, extraocular movements intact  Respiratory: Patient's speak in full sentences and does not appear short of breath  Cardiovascular: No lower extremity edema, non tender, no erythema  Neck exam dizziness mild loss of  lordosis.  Tightness left-sided parascapular region.  Mild tightness noted in the paraspinal musculature and also into the lumbar spine regarding the lack  Osteopathic findings  C2 flexed rotated and side bent right C6 flexed rotated and side bent left T7 extended rotated and side bent left inhaled rib L2 flexed rotated and side bent right Sacrum right on right       Assessment and Plan:  Neck pain on right side Patient has had right-sided neck pain but now more of a left-sided today.  Seem to be more secondary to the scapular region.  Discussed posture ergonomics in which activities to doing which wants to avoid.  Increase activity slowly.  Discussed icing regimen and home exercises.  Follow-up with me again in 6 to 8 weeks.   Nonallopathic problems  Decision today to treat with OMT was based on Physical Exam  After verbal consent patient was treated with HVLA, ME, FPR techniques in cervical, rib, thoracic, lumbar, and sacral  areas  Patient tolerated the procedure well with improvement in symptoms  Patient given exercises, stretches and lifestyle modifications  See medications in patient instructions if given  Patient will follow up in 4-8 weeks      The above documentation has been reviewed and is accurate and complete , DO        Note: This dictation was prepared with Dragon dictation along with smaller phrase technology. Any transcriptional errors that result from this process are unintentional.

## 2021-04-26 ENCOUNTER — Other Ambulatory Visit: Payer: Self-pay

## 2021-04-26 ENCOUNTER — Encounter: Payer: Self-pay | Admitting: Family Medicine

## 2021-04-26 ENCOUNTER — Ambulatory Visit (INDEPENDENT_AMBULATORY_CARE_PROVIDER_SITE_OTHER): Payer: 59 | Admitting: Family Medicine

## 2021-04-26 VITALS — BP 122/84 | HR 79 | Ht 69.0 in | Wt 216.0 lb

## 2021-04-26 DIAGNOSIS — M542 Cervicalgia: Secondary | ICD-10-CM | POA: Diagnosis not present

## 2021-04-26 DIAGNOSIS — M9904 Segmental and somatic dysfunction of sacral region: Secondary | ICD-10-CM

## 2021-04-26 DIAGNOSIS — M9902 Segmental and somatic dysfunction of thoracic region: Secondary | ICD-10-CM | POA: Diagnosis not present

## 2021-04-26 DIAGNOSIS — M9908 Segmental and somatic dysfunction of rib cage: Secondary | ICD-10-CM

## 2021-04-26 DIAGNOSIS — M9903 Segmental and somatic dysfunction of lumbar region: Secondary | ICD-10-CM | POA: Diagnosis not present

## 2021-04-26 DIAGNOSIS — M9901 Segmental and somatic dysfunction of cervical region: Secondary | ICD-10-CM | POA: Diagnosis not present

## 2021-04-26 NOTE — Assessment & Plan Note (Signed)
Patient has had right-sided neck pain but now more of a left-sided today.  Seem to be more secondary to the scapular region.  Discussed posture ergonomics in which activities to doing which wants to avoid.  Increase activity slowly.  Discussed icing regimen and home exercises.  Follow-up with me again in 6 to 8 weeks.

## 2021-04-26 NOTE — Patient Instructions (Addendum)
Good to see you  Scapular exercises given  Keeps hands in Periferal view when working out See me again in 6 weeks

## 2021-06-07 ENCOUNTER — Ambulatory Visit: Payer: 59 | Admitting: Family Medicine

## 2021-11-01 DIAGNOSIS — Z125 Encounter for screening for malignant neoplasm of prostate: Secondary | ICD-10-CM | POA: Diagnosis not present

## 2021-11-01 DIAGNOSIS — Z13220 Encounter for screening for lipoid disorders: Secondary | ICD-10-CM | POA: Diagnosis not present

## 2021-11-01 DIAGNOSIS — Z0000 Encounter for general adult medical examination without abnormal findings: Secondary | ICD-10-CM | POA: Diagnosis not present

## 2022-03-20 IMAGING — XA DG INJECT/[PERSON_NAME] INC NEEDLE/CATH/PLC EPI/CERV/THOR W/IMG
2 series · 2 of 2 positions shown · non-contrast
Comparison: none

CLINICAL DATA: Spondylosis without myelopathy. Right foraminal
stenosis C5-6. Right arm radicular symptoms.

[Series 1: ortho standard · 1 of 1 slices shown (1 of 2)]
[im 1/1]
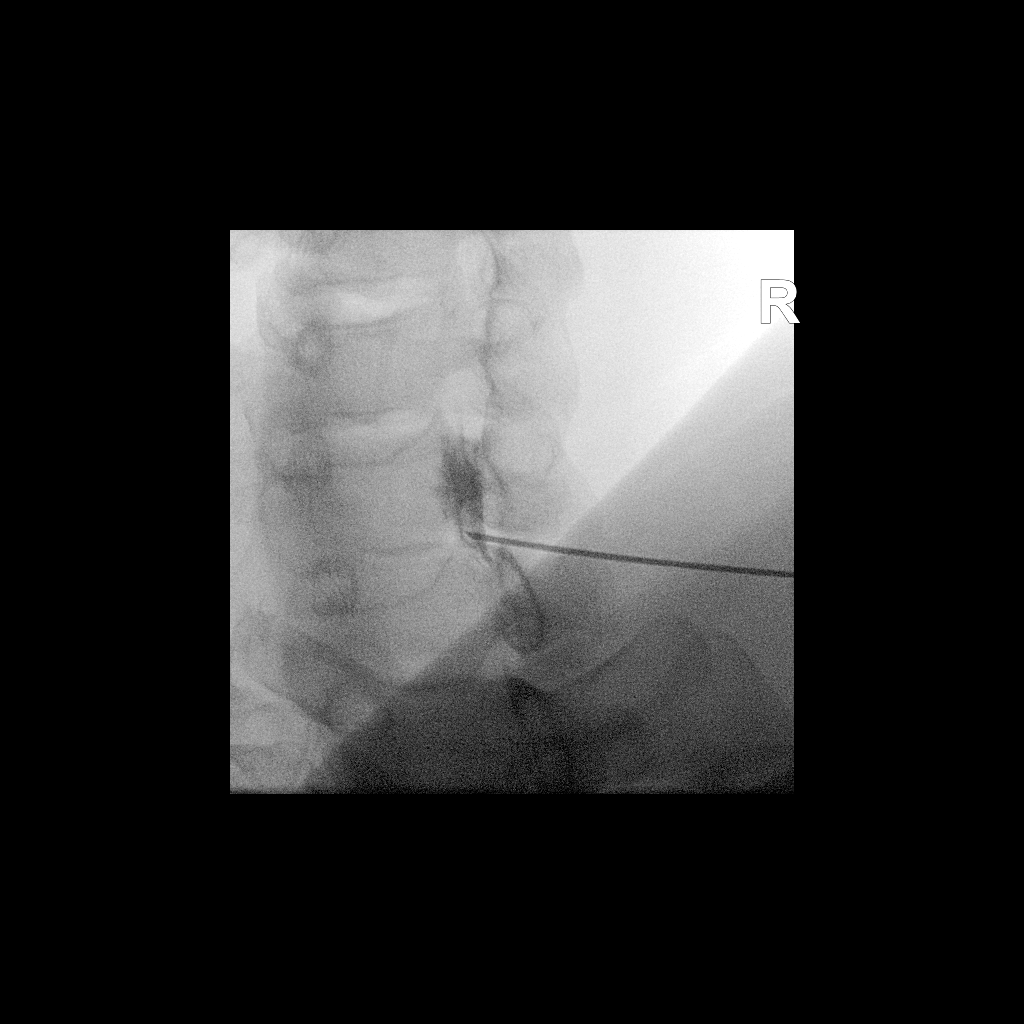

[Series 2: ortho standard · 1 of 1 slices shown (2 of 2)]
[im 1/1]
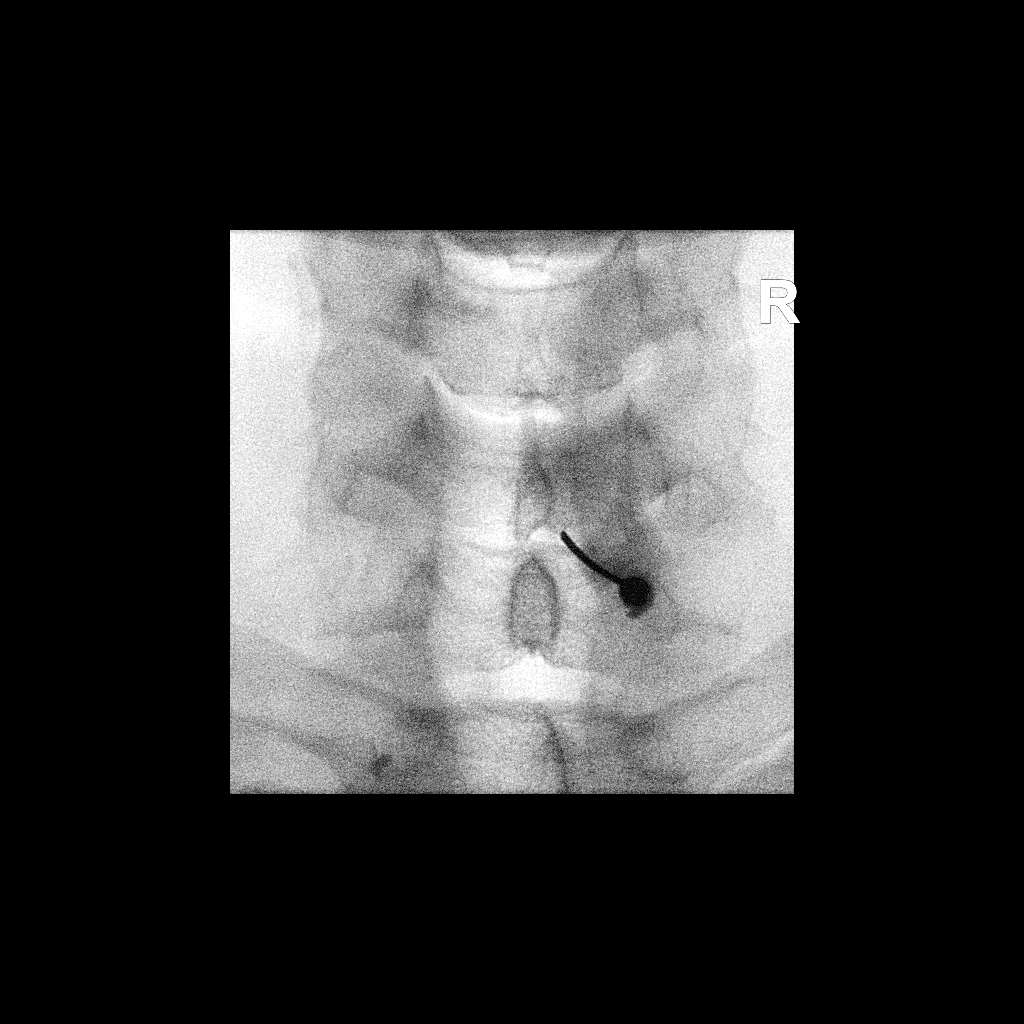

[2 of 2 positions shown; findings below may reference images not displayed]

FLUOROSCOPY TIME:  0 minutes 39 seconds. 7.51 micro gray meter
squared

PROCEDURE:
CERVICAL EPIDURAL INJECTION

An interlaminar approach was performed on the right at C6-7, due to
the presence of ample epidural fat. A 20 gauge epidural needle was
advanced using loss-of-resistance technique.

DIAGNOSTIC EPIDURAL INJECTION

Injection of Isovue-M 300 shows a good epidural pattern with spread
above and below the level of needle placement, primarily on the
right in the cranial direction. No vascular opacification is seen.
THERAPEUTIC

EPIDURAL INJECTION

1.5 ml of Kenalog 40 mixed with 1 ml of 1% Lidocaine and 2 ml of
normal saline were then instilled. The procedure was well-tolerated,
and the patient was discharged thirty minutes following the
injection in good condition.
IMPRESSION: Technically successful initial epidural injection on the right at
C6-7.

## 2022-06-08 DIAGNOSIS — F320 Major depressive disorder, single episode, mild: Secondary | ICD-10-CM | POA: Diagnosis not present

## 2022-06-08 DIAGNOSIS — E782 Mixed hyperlipidemia: Secondary | ICD-10-CM | POA: Diagnosis not present

## 2022-09-04 NOTE — Progress Notes (Unsigned)
New Patient Note  RE: [facility] MRN: [id number] DOB: 05-12-1986 Date of Office Visit: 09/05/2022  Consult requested by: Orpah Melter, MD Primary care provider: Orpah Melter, MD  Chief Complaint: No chief complaint on file.  History of Present Illness: I had the pleasure of seeing Lance West for initial evaluation at the [facility] on 09/04/2022. He is a 37 y.o. male, who is referred here by Orpah Melter, MD for the evaluation of ***.  ***  Assessment and Plan: Lance West is a 37 y.o. male with: No problem-specific Assessment & Plan notes found for this encounter.  No follow-ups on file.  No orders of the defined types were placed in this encounter.  Lab Orders  No laboratory test(s) ordered today    Other allergy screening: Asthma: {Blank single:19197::"yes","no"} Rhino conjunctivitis: {Blank single:19197::"yes","no"} Food allergy: {Blank single:19197::"yes","no"} Medication allergy: {Blank single:19197::"yes","no"} Hymenoptera allergy: {Blank single:19197::"yes","no"} Urticaria: {Blank single:19197::"yes","no"} Eczema:{Blank single:19197::"yes","no"} History of recurrent infections suggestive of immunodeficency: {Blank single:19197::"yes","no"}  Diagnostics: Spirometry:  Tracings reviewed. His effort: {Blank single:19197::"Good reproducible efforts.","It was hard to get consistent efforts and there is a question as to whether this reflects a maximal maneuver.","Poor effort, data can not be interpreted."} FVC: ***L FEV1: ***L, ***% predicted FEV1/FVC ratio: ***% Interpretation: {Blank single:19197::"Spirometry consistent with mild obstructive disease","Spirometry consistent with moderate obstructive disease","Spirometry consistent with severe obstructive disease","Spirometry consistent with possible restrictive disease","Spirometry consistent with mixed obstructive and restrictive disease","Spirometry uninterpretable due to  technique","Spirometry consistent with normal pattern","No overt abnormalities noted given today's efforts"}.  Please see scanned spirometry results for details.  Skin Testing: {Blank single:19197::"Select foods","Environmental allergy panel","Environmental allergy panel and select foods","Food allergy panel","None","Deferred due to recent antihistamines use"}. *** Results discussed with patient/family.   Past Medical History: Patient Active Problem List   Diagnosis Date Noted  . Neck pain on right side 12/15/2020  . Somatic dysfunction of spine, cervical 12/15/2020  . Syncope 10/07/2015   No past medical history on file. Past Surgical History: No past surgical history on file. Medication List:  Current Outpatient Medications  Medication Sig Dispense Refill  . Cyanocobalamin (VITAMIN B 12 PO) Take 1 tablet by mouth daily.    . DULoxetine (CYMBALTA) 60 MG capsule Take 60 mg by mouth daily.     No current facility-administered medications for this visit.   Allergies: Allergies  Allergen Reactions  . Ancef [Cefazolin]     Nausea    Social History: Social History   Socioeconomic History  . Marital status: Married    Spouse name: Not on file  . Number of children: Not on file  . Years of education: Not on file  . Highest education level: Not on file  Occupational History  . Not on file  Tobacco Use  . Smoking status: Never  . Smokeless tobacco: Not on file  Substance and Sexual Activity  . Alcohol use: No  . Drug use: Not on file  . Sexual activity: Not on file  Other Topics Concern  . Not on file  Social History Narrative  . Not on file   Social Determinants of Health   Financial Resource Strain: Not on file  Food Insecurity: Not on file  Transportation Needs: Not on file  Physical Activity: Not on file  Stress: Not on file  Social Connections: Not on file   Lives in a ***. Smoking: *** Occupation: ***  Environmental History: Water Damage/mildew in the  house: Estate agent in the family room: {Blank single:19197::"yes","no"} Carpet in the bedroom: {  Blank single:19197::"yes","no"} Heating: {Blank single:19197::"electric","gas","heat pump"} Cooling: {Blank single:19197::"central","window","heat pump"} Pet: {Blank single:19197::"yes ***","no"}  Family History: No family history on file. Problem                               Relation Asthma                                   *** Eczema                                *** Food allergy                          *** Allergic rhino conjunctivitis     ***  Review of Systems  Constitutional:  Negative for appetite change, chills, fever and unexpected weight change.  HENT:  Negative for congestion and rhinorrhea.   Eyes:  Negative for itching.  Respiratory:  Negative for cough, chest tightness, shortness of breath and wheezing.   Cardiovascular:  Negative for chest pain.  Gastrointestinal:  Negative for abdominal pain.  Genitourinary:  Negative for difficulty urinating.  Skin:  Negative for rash.  Neurological:  Negative for headaches.   Objective: There were no vitals taken for this visit. There is no height or weight on file to calculate BMI. Physical Exam Vitals and nursing note reviewed.  Constitutional:      Appearance: Normal appearance. He is well-developed.  HENT:     Head: Normocephalic and atraumatic.     Right Ear: Tympanic membrane and external ear normal.     Left Ear: Tympanic membrane and external ear normal.     Nose: Nose normal.     Mouth/Throat:     Mouth: Mucous membranes are moist.     Pharynx: Oropharynx is clear.  Eyes:     Conjunctiva/sclera: Conjunctivae normal.  Cardiovascular:     Rate and Rhythm: Normal rate and regular rhythm.     Heart sounds: Normal heart sounds. No murmur heard.    No friction rub. No gallop.  Pulmonary:     Effort: Pulmonary effort is normal.     Breath sounds: Normal breath sounds. No wheezing, rhonchi  or rales.  Musculoskeletal:     Cervical back: Neck supple.  Skin:    General: Skin is warm.     Findings: No rash.  Neurological:     Mental Status: He is alert and oriented to person, place, and time.  Psychiatric:        Behavior: Behavior normal.  The plan was reviewed with the patient/family, and all questions/concerned were addressed.  It was my pleasure to see Lance West today and participate in his care. Please feel free to contact me with any questions or concerns.  Sincerely,  Rexene Alberts, DO Allergy & Immunology  [facility] office: [place] office: [phone]

## 2022-09-05 ENCOUNTER — Encounter: Payer: Self-pay | Admitting: Allergy

## 2022-09-05 ENCOUNTER — Ambulatory Visit: Payer: BC Managed Care – PPO | Admitting: Allergy

## 2022-09-05 VITALS — BP 115/78 | HR 84 | Temp 98.4°F | Resp 16 | Ht 68.25 in | Wt 224.8 lb

## 2022-09-05 DIAGNOSIS — T781XXD Other adverse food reactions, not elsewhere classified, subsequent encounter: Secondary | ICD-10-CM

## 2022-09-05 DIAGNOSIS — H1013 Acute atopic conjunctivitis, bilateral: Secondary | ICD-10-CM | POA: Insufficient documentation

## 2022-09-05 DIAGNOSIS — J3089 Other allergic rhinitis: Secondary | ICD-10-CM | POA: Diagnosis not present

## 2022-09-05 DIAGNOSIS — K219 Gastro-esophageal reflux disease without esophagitis: Secondary | ICD-10-CM | POA: Diagnosis not present

## 2022-09-05 DIAGNOSIS — K9049 Malabsorption due to intolerance, not elsewhere classified: Secondary | ICD-10-CM

## 2022-09-05 DIAGNOSIS — J4540 Moderate persistent asthma, uncomplicated: Secondary | ICD-10-CM | POA: Insufficient documentation

## 2022-09-05 MED ORDER — FLUTICASONE FUROATE-VILANTEROL 100-25 MCG/ACT IN AEPB: 1.0000 | INHALATION_SPRAY | Freq: Every day | RESPIRATORY_TRACT | 3 refills | Status: AC

## 2022-09-05 NOTE — Assessment & Plan Note (Signed)
Noted increased eye issues which then goes to his sinuses then his chest since the fall of 2023. Tried OTC antihistamines with some benefit but it makes him drowsy. Concerned about allergic triggers. Unable to skin test today due to recent antihistamine intake. Return for skin testing - no antihistamines for 3 days.  Patient will check if bloodwork may be cheaper.  Bring the eye drops and over the counter pill that you are taking. Start Ryaltris (olopatadine + mometasone nasal spray combination) 1-2 sprays per nostril twice a day. Samples given. This replaces your other nasal sprays. If this works well for you, then have Blinkrx ship the  Nasal saline spray (i.e., Simply Saline) or nasal saline lavage (i.e., NeilMed) is recommended as needed and prior to medicated nasal sprays.

## 2022-09-05 NOTE — Assessment & Plan Note (Signed)
Question reflux/heartburn.  See handout for lifestyle and dietary modifications. Will hold off adding meds for this at this time.

## 2022-09-05 NOTE — Assessment & Plan Note (Signed)
Sulfite and wine cause nasal congestion the following day. No testing available for this. Recommend to avoid sulfites and wine.

## 2022-09-05 NOTE — Assessment & Plan Note (Signed)
Diagnosed with asthma over 30+ years ago. Using albuterol daily for the past 6-8 months. Had Covid-19 in 2023.  Today's spirometry showed: normal pattern with no improvement in FEV1 post bronchodilator treatment. Clinically feeling unchanged.  Will start daily controller medication due to daily symptoms.  Daily controller medication(s): start Breo 157mcg 1 puff once a day and rinse mouth after each use. Demonstrated proper use. May use albuterol rescue inhaler 2 puffs every 4 to 6 hours as needed for shortness of breath, chest tightness, coughing, and wheezing.  Monitor frequency of use.  Get spirometry at next visit.

## 2022-09-05 NOTE — Patient Instructions (Addendum)
Unable to skin test today due to recent antihistamine intake. Return for skin testing - no antihistamines for 3 days.   Bring the eye drops and over the counter pill that you are taking. Start Ryaltris (olopatadine + mometasone nasal spray combination) 1-2 sprays per nostril twice a day. Samples given. This replaces your other nasal sprays. If this works well for you, then have Blinkrx ship the  Nasal saline spray (i.e., Simply Saline) or nasal saline lavage (i.e., NeilMed) is recommended as needed and prior to medicated nasal sprays.  Breathing  Daily controller medication(s): start Breo 164mcg 1 puff once a day and rinse mouth after each use.  May use albuterol rescue inhaler 2 puffs every 4 to 6 hours as needed for shortness of breath, chest tightness, coughing, and wheezing.  Monitor frequency of use.  Breathing control goals:  Full participation in all desired activities (may need albuterol before activity) Albuterol use two times or less a week on average (not counting use with activity) Cough interfering with sleep two times or less a month Oral steroids no more than once a year No hospitalizations   Heartburn: See handout for lifestyle and dietary modifications.  Check to see if bloodwork is cheaper than skin testing.  Follow up in 1 month to check on your breathing and possible skin testing.

## 2022-10-03 ENCOUNTER — Ambulatory Visit: Payer: BC Managed Care – PPO | Admitting: Allergy
